# Patient Record
Sex: Male | Born: 1975 | Race: Black or African American | Marital: Single | Smoking: Never smoker
Health system: Southern US, Community
[De-identification: ages and names within clinical notes are randomized; demographics above are authoritative.]

---

## 2005-08-13 ENCOUNTER — Emergency Department (HOSPITAL_COMMUNITY): Admission: EM | Admit: 2005-08-13 | Discharge: 2005-08-13 | Payer: Self-pay | Admitting: Family Medicine

## 2010-02-04 ENCOUNTER — Emergency Department (HOSPITAL_COMMUNITY): Admission: EM | Admit: 2010-02-04 | Discharge: 2010-02-04 | Payer: Self-pay | Admitting: Emergency Medicine

## 2010-08-21 LAB — DIFFERENTIAL
Eosinophils Absolute: 0.2 10*3/uL (ref 0.0–0.7)
Eosinophils Relative: 2 % (ref 0–5)
Neutro Abs: 2.3 10*3/uL (ref 1.7–7.7)
Neutrophils Relative %: 36 % — ABNORMAL LOW (ref 43–77)

## 2010-08-21 LAB — BASIC METABOLIC PANEL
BUN: 5 mg/dL — ABNORMAL LOW (ref 6–23)
CO2: 22 mEq/L (ref 19–32)
Calcium: 9.1 mg/dL (ref 8.4–10.5)
Chloride: 110 mEq/L (ref 96–112)
Creatinine, Ser: 1.07 mg/dL (ref 0.4–1.5)
GFR calc Af Amer: >60 mL/min (ref >60)
GFR calc non Af Amer: >60 mL/min (ref >60)
Sodium: 142 mEq/L (ref 135–145)

## 2010-08-21 LAB — CBC
HCT: 44.9 % (ref 39.0–52.0)
MCV: 97.8 fL (ref 78.0–100.0)
Platelets: 241 10*3/uL (ref 150–400)
RBC: 4.59 MIL/uL (ref 4.22–5.81)

## 2010-08-21 LAB — ETHANOL: Alcohol, Ethyl (B): 13 mg/dL — ABNORMAL HIGH (ref 0–10)

## 2011-11-06 IMAGING — CR DG NECK SOFT TISSUE
2 series · 2 of 2 positions shown · non-contrast
Comparison: None.

CLINICAL DATA: Neck pain.  Vomiting.  Chills.

NECK SOFT TISSUES - 1+ VIEW

[w soft tissue neck]
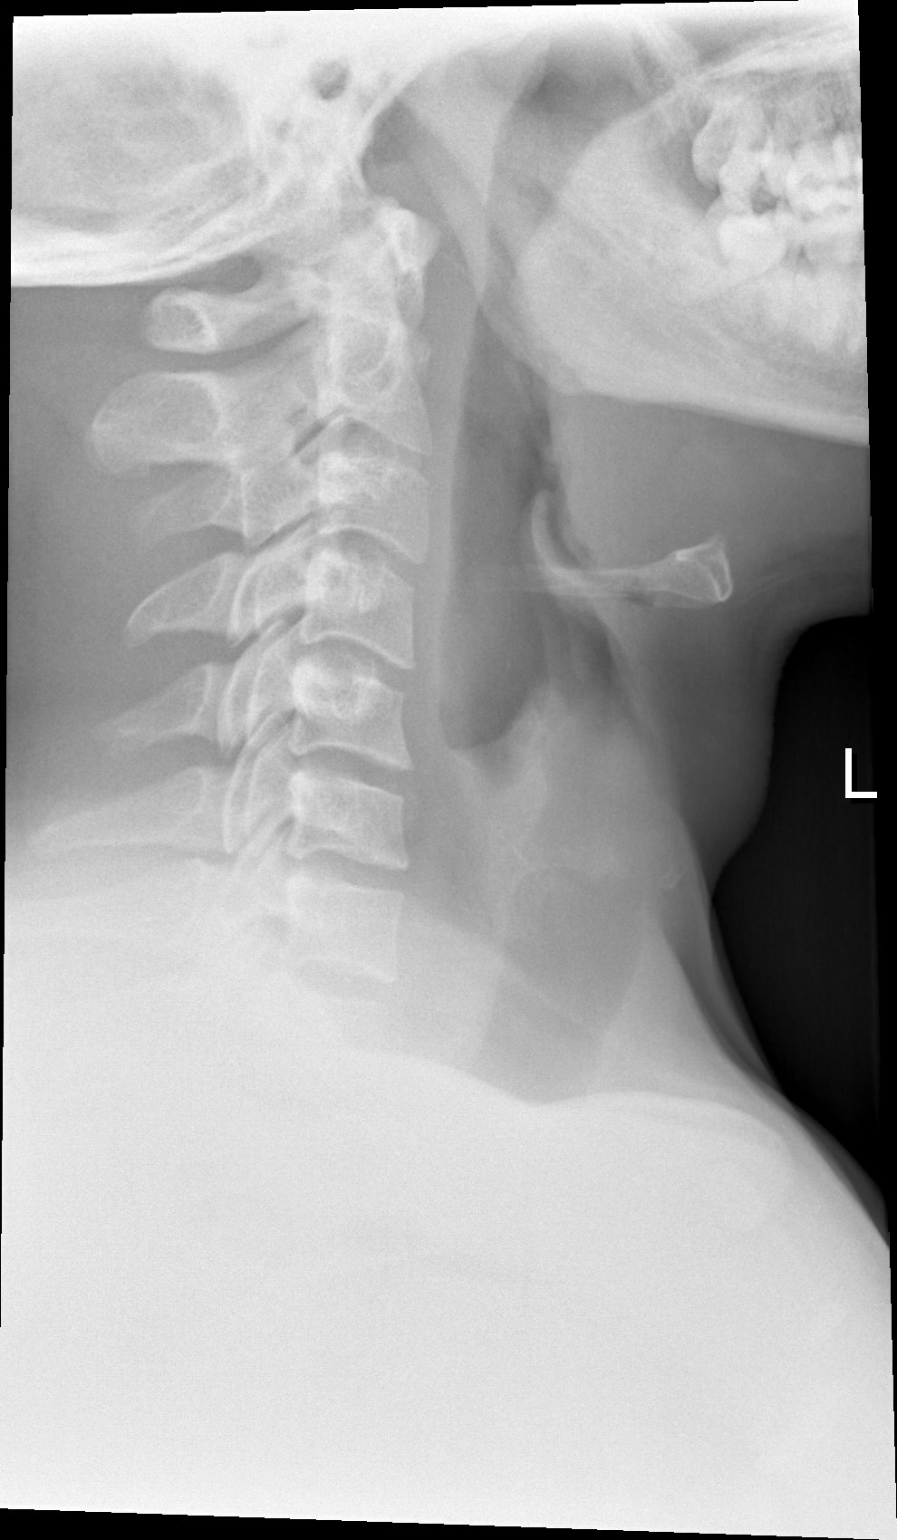

[w soft tissue neck ap]
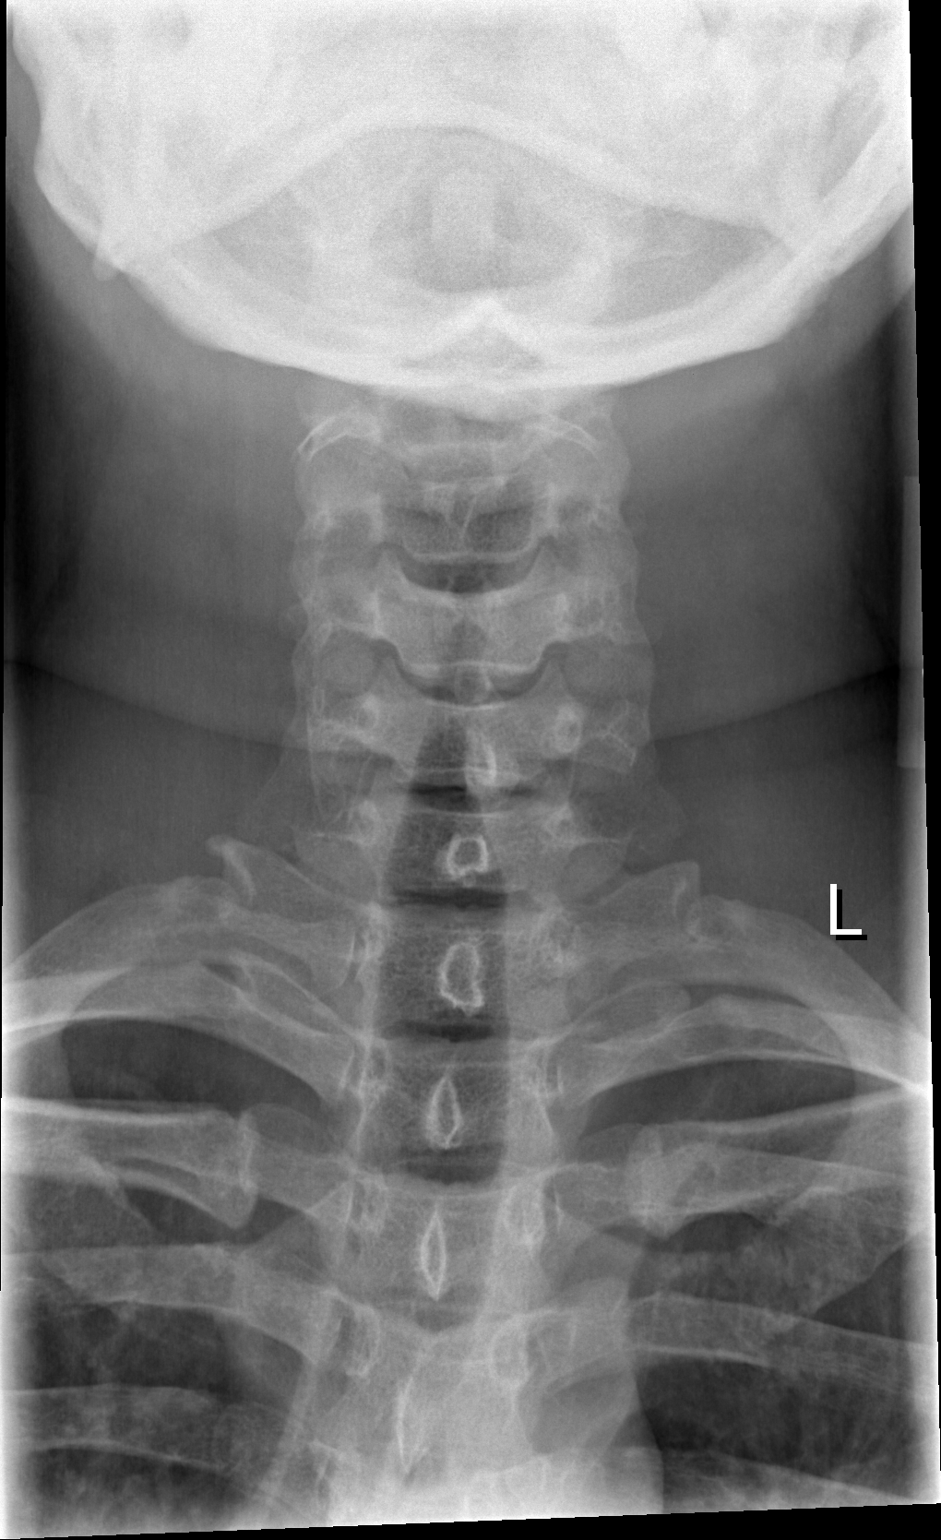

[2 of 2 positions shown; findings below may reference images not displayed]

FINDINGS: Tracheal air column appears within normal limits.  The
epiglottis and aryepiglottic folds are normal.  Visualized cervical
spine and prevertebral soft tissues are unremarkable.  No
radiopaque foreign body identified.
IMPRESSION: Negative.

## 2014-05-19 ENCOUNTER — Ambulatory Visit (INDEPENDENT_AMBULATORY_CARE_PROVIDER_SITE_OTHER): Payer: Self-pay | Admitting: Internal Medicine

## 2014-05-19 VITALS — BP 129/78 | HR 65 | Temp 98.1°F | Resp 20 | Ht 61.5 in | Wt 158.1 lb

## 2014-05-19 DIAGNOSIS — K047 Periapical abscess without sinus: Secondary | ICD-10-CM

## 2014-05-19 MED ORDER — HYDROCODONE-ACETAMINOPHEN 5-325 MG PO TABS: 1.0000 | ORAL_TABLET | Freq: Four times a day (QID) | ORAL | Status: DC | PRN

## 2014-05-19 MED ORDER — AMOXICILLIN 875 MG PO TABS: 875.0000 mg | ORAL_TABLET | Freq: Two times a day (BID) | ORAL | Status: DC

## 2014-05-21 NOTE — Progress Notes (Signed)
Complaining of progressively worsening pain in the right jaw area over the last 2 months. Over the last 2 days he can't sleep. He feels like the upper back Tooth on the right has cracked and is sharp. He has no dental care and no insurance.  No underlying medical problems or medications.  Exam BP 129/78 mmHg  Pulse 65  Temp(Src) 98.1 F (36.7 C) (Oral)  Resp 20  Ht 5' 1.5" (1.562 m)  Wt 158 lb 2 oz (71.725 kg)  BMI 29.40 kg/m2  SpO2 100% Oral cavity reveals a dental abscess around the upper molar on the right with some gum inflammation behind the lower molar on the right No local adenopathy There is no enamel destruction visible but the teeth have obviously not being cleaned in a long time  Impression Dental abscess with probable cracked tooth Meds ordered this encounter  Medications  . amoxicillin (AMOXIL) 875 MG tablet    Sig: Take 1 tablet (875 mg total) by mouth 2 (two) times daily.    Dispense:  20 tablet    Refill:  0  . HYDROcodone-acetaminophen (NORCO/VICODIN) 5-325 MG per tablet    Sig: Take 1 tablet by mouth every 6 (six) hours as needed for moderate pain.    Dispense:  12 tablet    Refill:  0   To set up dental evaluation in 7-10 days

## 2014-05-26 ENCOUNTER — Encounter (HOSPITAL_COMMUNITY): Payer: Self-pay | Admitting: *Deleted

## 2014-05-26 ENCOUNTER — Emergency Department (HOSPITAL_COMMUNITY)
Admission: EM | Admit: 2014-05-26 | Discharge: 2014-05-26 | Disposition: A | Discharge status: Home / Self Care | Payer: Self-pay | Attending: Emergency Medicine | Admitting: Emergency Medicine

## 2014-05-26 DIAGNOSIS — K088 Other specified disorders of teeth and supporting structures: Secondary | ICD-10-CM | POA: Insufficient documentation

## 2014-05-26 DIAGNOSIS — Z792 Long term (current) use of antibiotics: Secondary | ICD-10-CM | POA: Insufficient documentation

## 2014-05-26 DIAGNOSIS — K029 Dental caries, unspecified: Secondary | ICD-10-CM | POA: Insufficient documentation

## 2014-05-26 DIAGNOSIS — K0889 Other specified disorders of teeth and supporting structures: Secondary | ICD-10-CM

## 2014-05-26 MED ORDER — HYDROCODONE-ACETAMINOPHEN 5-325 MG PO TABS: 1.0000 | ORAL_TABLET | ORAL | Status: DC | PRN

## 2014-05-26 MED ORDER — HYDROCODONE-ACETAMINOPHEN 5-325 MG PO TABS
1.0000 | ORAL_TABLET | Freq: Once | ORAL | Status: AC
  Administered 2014-05-26: 1 via ORAL
  Filled 2014-05-26: qty 1

## 2014-05-26 MED ORDER — NAPROXEN 500 MG PO TABS: 500.0000 mg | ORAL_TABLET | Freq: Two times a day (BID) | ORAL | Status: AC

## 2014-05-26 NOTE — ED Notes (Signed)
Pt was seen and treated last Sunday for same. Pt is currently taking amoxicillin but has run out of pain meds.

## 2014-05-26 NOTE — Discharge Instructions (Signed)
Follow the directions provided. Is very important to follow-up with a dentist to manage this tooth pain. Please take your medicines as directed. Don't hesitate to return for any new, worsening, or concerning symptoms.  SEEK IMMEDIATE MEDICAL CARE IF:  You have a fever.  You develop redness and swelling of your face, jaw, or neck.  You are unable to open your mouth.  You have severe pain uncontrolled by pain medicine.   Emergency Department Resource Guide 1) Find a Doctor and Pay Out of Pocket Although you won't have to find out who is covered by your insurance plan, it is a good idea to ask around and get recommendations. You will then need to call the office and see if the doctor you have chosen will accept you as a new patient and what types of options they offer for patients who are self-pay. Some doctors offer discounts or will set up payment plans for their patients who do not have insurance, but you will need to ask so you aren't surprised when you get to your appointment.  2) Contact Your Local Health Department Not all health departments have doctors that can see patients for sick visits, but many do, so it is worth a call to see if yours does. If you don't know where your local health department is, you can check in your phone book. The CDC also has a tool to help you locate your state's health department, and many state websites also have listings of all of their local health departments.  3) Find a Walk-in Clinic If your illness is not likely to be very severe or complicated, you may want to try a walk in clinic. These are popping up all over the country in pharmacies, drugstores, and shopping centers. They're usually staffed by nurse practitioners or physician assistants that have been trained to treat common illnesses and complaints. They're usually fairly quick and inexpensive. However, if you have serious medical issues or chronic medical problems, these are probably not your best  option.  No Primary Care Doctor: - Call Health Connect at  [phone] - they can help you locate a primary care doctor that  accepts your insurance, provides certain services, etc. - Physician Referral [phone]- [phone]   Dental Care: Organization         Address  Phone  Notes  [facility] [address], [place] [phone] Accepts children up to age 37 who are enrolled in [place] or [place] Health Choice; pregnant women with a Medicaid card; and children who have applied for Medicaid or [place] Health Choice, but were declined, whose parents can pay a reduced fee at time of service.  [facility] [place]  [address], [place] [phone] Accepts children up to age 37 who are enrolled in [place] or [place] Health Choice; pregnant women with a Medicaid card; and children who have applied for Medicaid or [place] Health Choice, but were declined, whose parents can pay a reduced fee at time of service.  [facility]  [address], [place] [phone] Patients are seen by appointment only. Walk-ins are not accepted. [facility] will see patients 37 years of age and older. Monday - Tuesday (8am-5pm) Most Wednesdays (8:30-5pm) $30 per visit, cash only  [facility]  [address], [place] [phone] Patients are seen by appointment only. Walk-ins are not accepted.  [facility] will see patients 37 years of age and older. One Wednesday Evening (Monthly: Volunteer Based).  $30 per visit, cash only  [facility]  [phone] for adults; Children under age 64, call Graduate Pediatric Dentistry at [phone]. Children aged 604-14, please call [phone] to request a pediatric application.  Dental services are provided in all areas of dental care including fillings, crowns and bridges, complete and  partial dentures, implants, gum treatment, root canals, and extractions. Preventive care is also provided. Treatment is provided to both adults and children. Patients are selected via a lottery and there is often a waiting list.   [facility] [address], [place]  [phone] [email or web address]   [facility] [address], [place], [place] [phone], [phone]. [phone] Second and Fourth Thursday of each month, opens at 6:30 AM; Clinic ends at 9 AM.  Patients are seen on a first-come first-served basis, and a limited number are seen during each clinic.   [facility]  [address], [place], [place] [phone]   Eligibility Requirements You must have lived in [place], [place], or [place] counties for at least the last three months.   You cannot be eligible for state or federal sponsored [place]care insurance, including [employer], [place]icaid, or [employer]icare.   You generally cannot be eligible for healthcare insurance through your employer.    How to apply: Eligibility screenings are held every Tuesday and Wednesday afternoon from 1:00 pm until 4:00 pm. You do not need an appointment for the interview!  [facility] [address], [place], [place] [phone]   [facility]  [phone]   [facility]  [phone]   [facility]  [phone]

## 2014-05-26 NOTE — ED Notes (Signed)
Declined W/C at D/C and was escorted to lobby by RN. 

## 2014-05-26 NOTE — ED Provider Notes (Signed)
CSN: [id number]     Arrival date & time 05/26/14  [id number] History   First MD Initiated Contact with Patient 05/26/14 0759     Chief Complaint  Patient presents with  . Dental Pain     (Consider location/radiation/quality/duration/timing/severity/associated sxs/prior Treatment) HPI  38 year old male presenting to the emergency department with tooth pain times approximately one month, worse in the last week. He was seen in the urgent care and given antibiotics and pain medicine, but was not given a dental referral. His pain has continued but he is out of his pain medicines and would like something else for pain. He denies any fevers, chills, nausea, vomiting or facial swelling.  History reviewed. No pertinent past medical history. History reviewed. No pertinent past surgical history. Family History  Problem Relation Age of Onset  . Hyperlipidemia Mother   . Diabetes Father   . Hyperlipidemia Father   . Stroke Father    History  Substance Use Topics  . Smoking status: Never Smoker   . Smokeless tobacco: Never Used  . Alcohol Use: 1.2 oz/week    2 Not specified per week    Review of Systems  Constitutional: Negative for fever and chills.  HENT: Positive for dental problem.   Respiratory: Negative for shortness of breath.   Cardiovascular: Negative for chest pain.  Gastrointestinal: Negative for nausea and vomiting.  Musculoskeletal: Negative for neck pain.  Skin: Negative for color change.      Allergies  Review of patient's allergies indicates no known allergies.  Home Medications   Prior to Admission medications   Medication Sig Start Date End Date Taking? Authorizing Provider  amoxicillin (AMOXIL) 875 MG tablet Take 1 tablet (875 mg total) by mouth 2 (two) times daily. 05/19/14  Yes Tonye Pearsonobert P Doolittle, MD  benzocaine (ORAJEL) 10 % mucosal gel Use as directed 1 application in the mouth or throat as needed for mouth pain.   Yes Historical Provider, MD  ibuprofen  (ADVIL,MOTRIN) 200 MG tablet Take 400 mg by mouth every 6 (six) hours as needed for moderate pain.   Yes Historical Provider, MD  HYDROcodone-acetaminophen (NORCO/VICODIN) 5-325 MG per tablet Take 1 tablet by mouth every 6 (six) hours as needed for moderate pain. 05/19/14   Tonye Pearsonobert P Doolittle, MD   BP 128/86 mmHg  Pulse 80  Temp(Src) 97.2 F (36.2 C) (Oral)  Resp 20  SpO2 97% Physical Exam  Constitutional: He appears well-developed and well-nourished. No distress.  HENT:  Head: Normocephalic and atraumatic.  Mouth/Throat: Oropharynx is clear and moist. No trismus in the jaw. Dental caries present. No dental abscesses. No oropharyngeal exudate.    Eyes: Conjunctivae are normal.  Neck: Neck supple.  Cardiovascular: Normal rate, regular rhythm and intact distal pulses.   Pulmonary/Chest: Effort normal and breath sounds normal. No respiratory distress.  Abdominal: Soft. There is no tenderness.  Musculoskeletal: He exhibits no tenderness.  Lymphadenopathy:    He has no cervical adenopathy.  Neurological: He is alert.  Skin: Skin is warm and dry. No rash noted. He is not diaphoretic.  Nursing note and vitals reviewed.   ED Course  Procedures (including critical care time) Labs Review Labs Reviewed - No data to display  Imaging Review No results found.   EKG Interpretation None      MDM   Final diagnoses:  Pain, dental   38 yo with continued dental pain, Recently evaluated for same and started on abx and pain meds. From another facility.  Reports he was not  provided with any dental resources for follow-up. He has no signs of gross abscess or  Ludwig's angina or spread of infection. Resources provided for dental follow-up, short course of pain meds and instructions to use NSAIDS as adjunct for pain relief.  Pt reports understanding.     Filed Vitals:   05/26/14 0800  BP: 128/86  Pulse: 80  Temp: 97.2 F (36.2 C)  TempSrc: Oral  Resp: 20  SpO2: 97%    Meds given  in ED:  Medications  HYDROcodone-acetaminophen (NORCO/VICODIN) 5-325 MG per tablet 1 tablet (1 tablet Oral Given 05/26/14 0816)    Discharge Medication List as of 05/26/2014  8:24 AM    START taking these medications   Details  naproxen (NAPROSYN) 500 MG tablet Take 1 tablet (500 mg total) by mouth 2 (two) times daily., Starting 05/26/2014, Until Discontinued, Print           Harle BattiestElizabeth Crosley Stejskal, NP 05/27/14 1015  Elwin MochaBlair Walden, MD 05/27/14 [phone]

## 2017-06-15 ENCOUNTER — Other Ambulatory Visit: Payer: Self-pay

## 2017-06-15 ENCOUNTER — Ambulatory Visit (HOSPITAL_COMMUNITY)
Admission: EM | Admit: 2017-06-15 | Discharge: 2017-06-15 | Disposition: A | Discharge status: Home / Self Care | Payer: Self-pay | Attending: Family Medicine | Admitting: Family Medicine

## 2017-06-15 ENCOUNTER — Encounter (HOSPITAL_COMMUNITY): Payer: Self-pay

## 2017-06-15 DIAGNOSIS — K0889 Other specified disorders of teeth and supporting structures: Secondary | ICD-10-CM

## 2017-06-15 MED ORDER — HYDROCODONE-ACETAMINOPHEN 5-325 MG PO TABS: 1.0000 | ORAL_TABLET | Freq: Four times a day (QID) | ORAL | 0 refills | Status: AC | PRN

## 2017-06-15 MED ORDER — AMOXICILLIN 875 MG PO TABS: 875.0000 mg | ORAL_TABLET | Freq: Two times a day (BID) | ORAL | 0 refills | Status: AC

## 2017-06-15 NOTE — ED Triage Notes (Signed)
[facility], pt thinks he has a bad tooth

## 2017-06-15 NOTE — Discharge Instructions (Signed)
Be aware, pain medications may cause drowsiness. Please do not drive, operate heavy machinery or make important decisions while on this medication, it can cloud your judgement.  If not improving over the next 48 hours you should follow up with your dentist.

## 2017-06-15 NOTE — ED Notes (Signed)
Patient discharged by provider Brian Hagler, MD  

## 2017-06-21 NOTE — ED Provider Notes (Signed)
  [facility]   [id number] 06/15/17 Arrival Time: 1906  ASSESSMENT & PLAN:  1. Pain, dental     Meds ordered this encounter  Medications  . amoxicillin (AMOXIL) 875 MG tablet    Sig: Take 1 tablet (875 mg total) by mouth 2 (two) times daily.    Dispense:  20 tablet    Refill:  0  . HYDROcodone-acetaminophen (NORCO/VICODIN) 5-325 MG tablet    Sig: Take 1 tablet by mouth every 6 (six) hours as needed for moderate pain or severe pain.    Dispense:  8 tablet    Refill:  0    [place] Controlled Substances Registry consulted for this patient. I feel the risk/benefit ratio today is favorable for proceeding with this prescription for a controlled substance. Medication sedation precautions given.  Dental resource written instructions given. He will schedule dental evaluation as soon as possible.  Reviewed expectations re: course of current medical issues. Questions answered. Outlined signs and symptoms indicating need for more acute intervention. Patient verbalized understanding. After Visit Summary given.   SUBJECTIVE:  Justin Hanna is a 42 y.o. male who reports gradual onset of left upper dental pain. Present for 4 days. Afebrile. Tolerating PO intake but reports pain with chewing. He does not see a dentist regularly. OTC analgesics without relief. Afebrile. No neck swelling or pain. ROS: As per HPI.  OBJECTIVE:  Vitals:   06/15/17 1942  BP: (!) 161/98  Pulse: 70  Resp: 16  Temp: 98 F (36.7 C)  TempSrc: Oral  SpO2: 100%    General appearance: alert; no distress HENT: normocephalic; atraumatic; dentition: poor; gingival hypertrophy over L upper gums; no areas of fluctuance or drainage Neck: supple without LAD Lungs: normal respirations Skin: warm and dry Psychological: alert and cooperative; normal mood and affect   No Known Allergies  Social History   Socioeconomic History  . Marital status: Single    Spouse name: Not on file  . Number of children:  Not on file  . Years of education: Not on file  . Highest education level: Not on file  Social Needs  . Financial resource strain: Not on file  . Food insecurity - worry: Not on file  . Food insecurity - inability: Not on file  . Transportation needs - medical: Not on file  . Transportation needs - non-medical: Not on file  Occupational History  . Not on file  Tobacco Use  . Smoking status: Never Smoker  . Smokeless tobacco: Never Used  Substance and Sexual Activity  . Alcohol use: Yes    Alcohol/week: 1.2 oz    Types: 2 Standard drinks or equivalent per week  . Drug use: No  . Sexual activity: Not on file  Other Topics Concern  . Not on file  Social History Narrative  . Not on file   Family History  Problem Relation Age of Onset  . Hyperlipidemia Mother   . Diabetes Father   . Hyperlipidemia Father   . Stroke Father       Mardella LaymanHagler, Geana Walts, MD 06/21/17 [phone]

## 2019-01-02 ENCOUNTER — Other Ambulatory Visit: Payer: Self-pay

## 2019-01-02 DIAGNOSIS — Z20822 Contact with and (suspected) exposure to covid-19: Secondary | ICD-10-CM

## 2019-01-04 LAB — NOVEL CORONAVIRUS, NAA: SARS-CoV-2, NAA: NOT DETECTED
